# Patient Record
Sex: Female | Born: 1935 | Race: White | Hispanic: No | Smoking: Never smoker
Health system: Southern US, Community
[De-identification: ages and names within clinical notes are randomized; demographics above are authoritative.]

## PROBLEM LIST (undated history)

## (undated) DIAGNOSIS — I639 Cerebral infarction, unspecified: Secondary | ICD-10-CM

## (undated) DIAGNOSIS — Z9889 Other specified postprocedural states: Secondary | ICD-10-CM

## (undated) DIAGNOSIS — R112 Nausea with vomiting, unspecified: Secondary | ICD-10-CM

## (undated) DIAGNOSIS — M1990 Unspecified osteoarthritis, unspecified site: Secondary | ICD-10-CM

## (undated) DIAGNOSIS — M8580 Other specified disorders of bone density and structure, unspecified site: Secondary | ICD-10-CM

## (undated) DIAGNOSIS — E7800 Pure hypercholesterolemia, unspecified: Secondary | ICD-10-CM

## (undated) DIAGNOSIS — I10 Essential (primary) hypertension: Secondary | ICD-10-CM

## (undated) DIAGNOSIS — T8859XA Other complications of anesthesia, initial encounter: Secondary | ICD-10-CM

## (undated) DIAGNOSIS — R519 Headache, unspecified: Secondary | ICD-10-CM

## (undated) DIAGNOSIS — T4145XA Adverse effect of unspecified anesthetic, initial encounter: Secondary | ICD-10-CM

## (undated) DIAGNOSIS — R51 Headache: Secondary | ICD-10-CM

## (undated) HISTORY — PX: ABDOMINAL HYSTERECTOMY: SHX81

## (undated) HISTORY — PX: CHOLECYSTECTOMY: SHX55

---

## 2003-11-09 ENCOUNTER — Ambulatory Visit: Payer: Self-pay | Admitting: Internal Medicine

## 2004-01-11 ENCOUNTER — Ambulatory Visit: Payer: Self-pay | Admitting: Internal Medicine

## 2004-08-01 ENCOUNTER — Ambulatory Visit: Payer: Self-pay | Admitting: Internal Medicine

## 2004-11-21 ENCOUNTER — Ambulatory Visit: Payer: Self-pay | Admitting: Internal Medicine

## 2004-12-02 ENCOUNTER — Ambulatory Visit: Payer: Self-pay | Admitting: Internal Medicine

## 2005-02-06 ENCOUNTER — Ambulatory Visit: Payer: Self-pay | Admitting: Internal Medicine

## 2005-12-02 ENCOUNTER — Ambulatory Visit: Payer: Self-pay | Admitting: Internal Medicine

## 2006-04-06 ENCOUNTER — Ambulatory Visit: Payer: Self-pay | Admitting: Internal Medicine

## 2006-08-09 ENCOUNTER — Ambulatory Visit: Payer: Self-pay | Admitting: Internal Medicine

## 2006-12-17 ENCOUNTER — Ambulatory Visit: Payer: Self-pay | Admitting: Internal Medicine

## 2006-12-28 ENCOUNTER — Ambulatory Visit: Payer: Self-pay | Admitting: Internal Medicine

## 2008-05-24 ENCOUNTER — Ambulatory Visit: Payer: Self-pay | Admitting: Internal Medicine

## 2008-05-28 ENCOUNTER — Ambulatory Visit: Payer: Self-pay | Admitting: Internal Medicine

## 2008-12-04 ENCOUNTER — Ambulatory Visit: Payer: Self-pay | Admitting: Surgery

## 2010-01-08 ENCOUNTER — Ambulatory Visit: Payer: Self-pay | Admitting: Internal Medicine

## 2010-10-22 IMAGING — US ULTRASOUND LEFT BREAST
1 series · 14 of 14 positions shown · non-contrast
Comparison: none

REASON FOR EXAM: left parenchymal density  US if needed
COMMENTS:

PROCEDURE:     US  - US BREAST LEFT  - May 28, 2008 [DATE]
RESULT:       No cystic or solid abnormalities are identified.
Nevertheless, needle localization and biopsy of the lesion demonstrated on
additional view mammography is suggested.

[Series 1: ultrasound left breast · 14 of 14 slices shown]
[im 1/14]
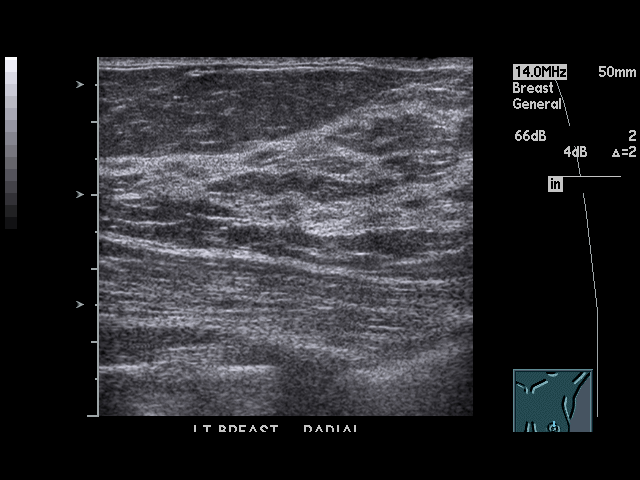
[im 2/14]
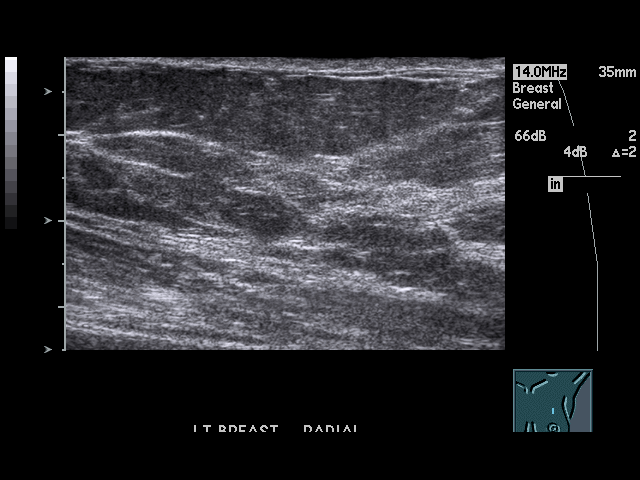
[im 3/14]
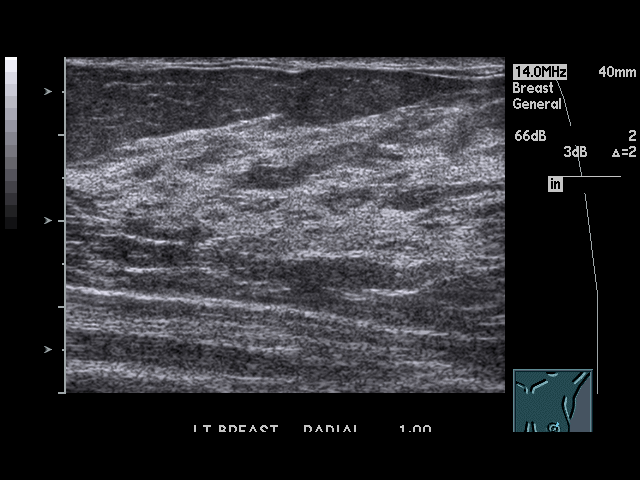
[im 4/14]
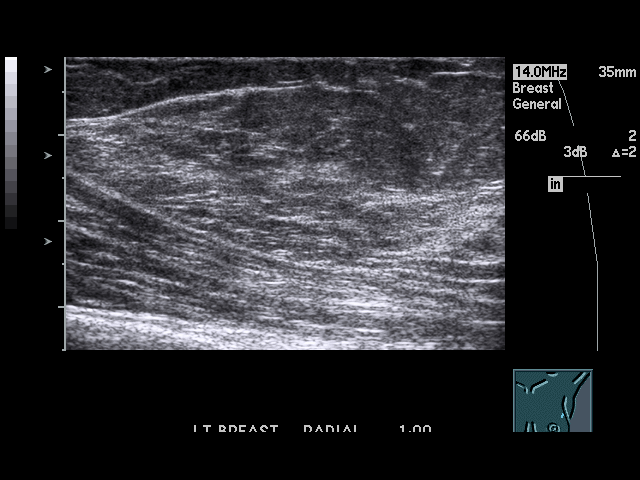
[im 5/14]
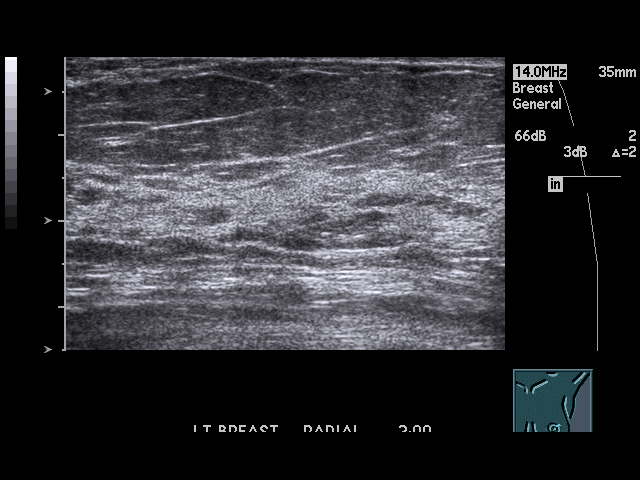
[im 6/14]
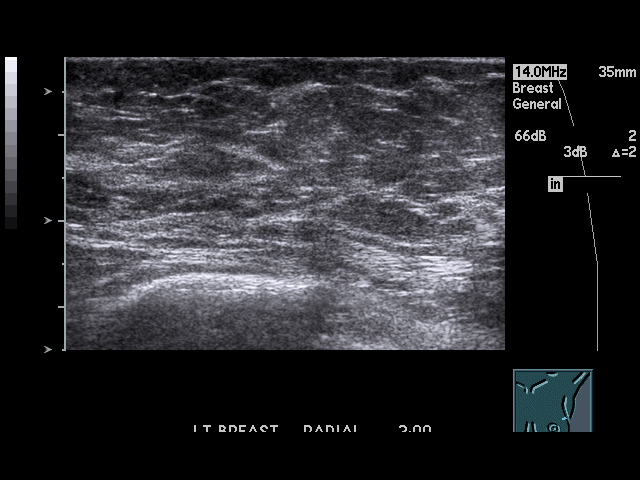
[im 7/14]
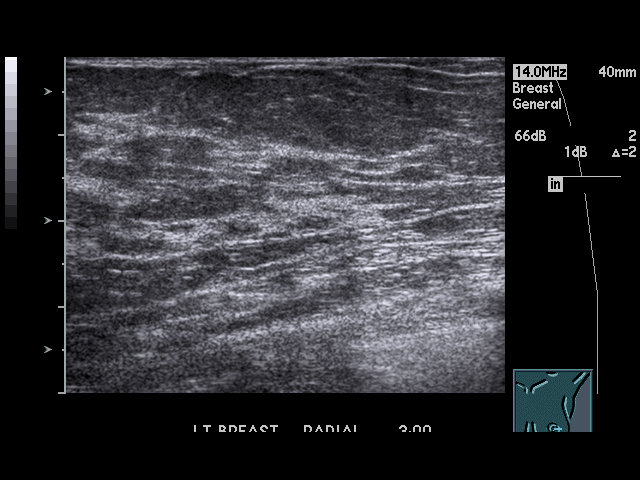
[im 8/14]
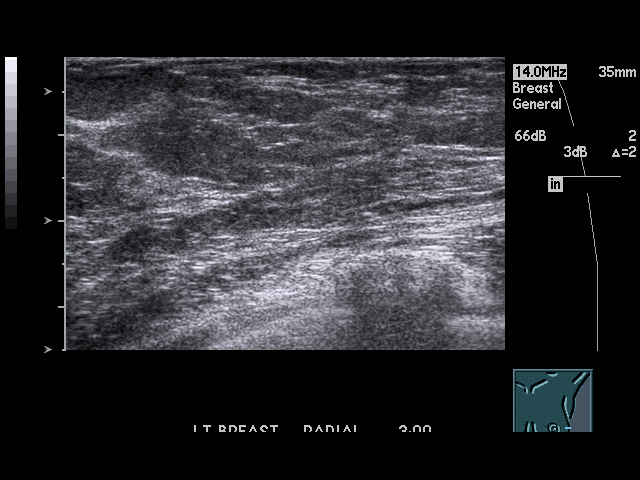
[im 9/14]
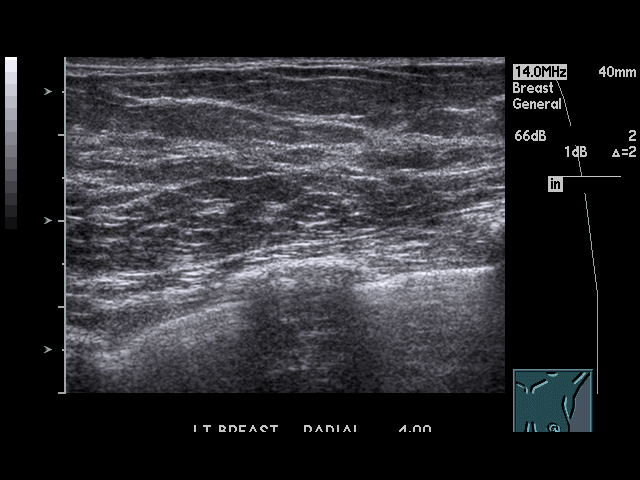
[im 10/14]
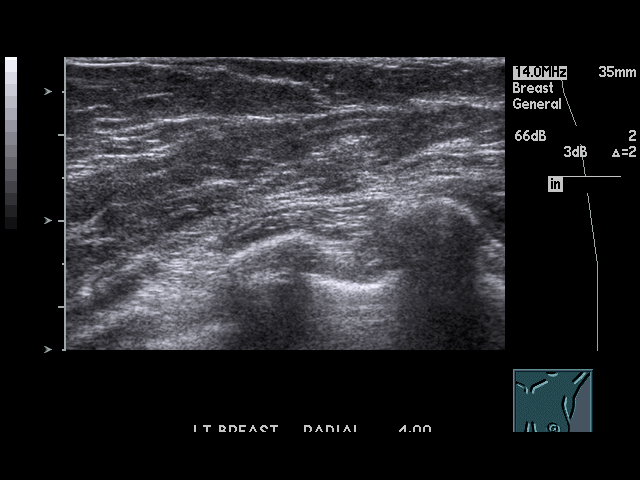
[im 11/14]
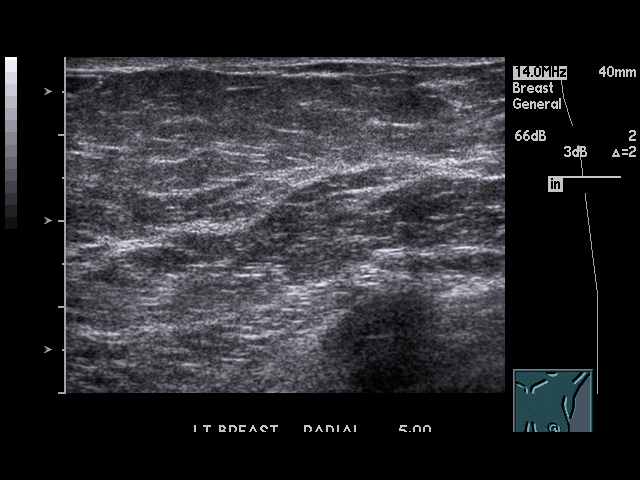
[im 12/14]
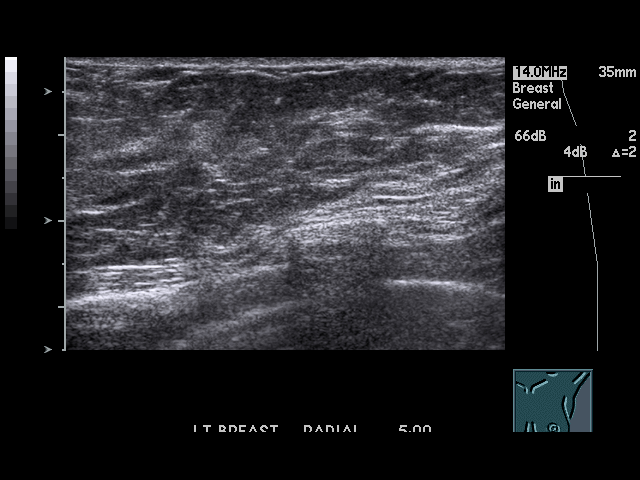
[im 13/14]
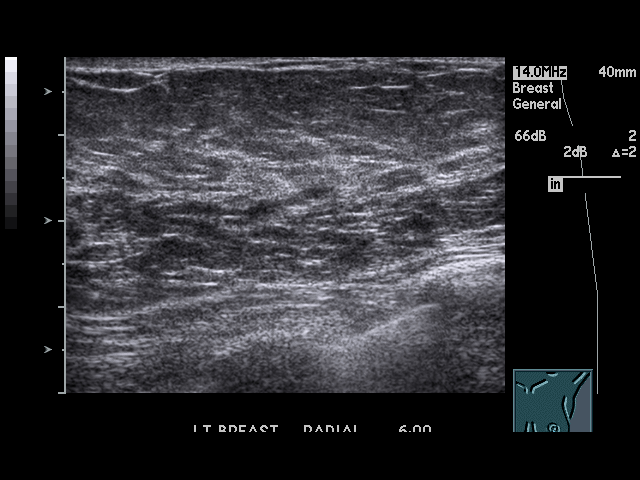
[im 14/14]
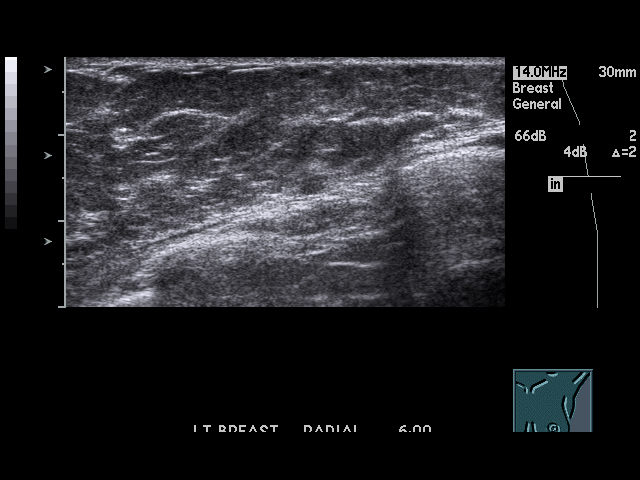

[14 of 14 positions shown; findings below may reference images not displayed]

IMPRESSION: BI-RADS:  Category 4- Suspicious Abnormality.

A negative mammogram report does not preclude biopsy or other evaluation of
a clinically palpable or otherwise suspicious mass or lesion.  Breast cancer
may not be detected by mammography in up to 10% of cases.

## 2011-03-10 ENCOUNTER — Ambulatory Visit: Payer: Self-pay | Admitting: Internal Medicine

## 2012-03-10 ENCOUNTER — Ambulatory Visit: Payer: Self-pay | Admitting: Internal Medicine

## 2013-03-13 ENCOUNTER — Ambulatory Visit: Payer: Self-pay | Admitting: Internal Medicine

## 2015-02-19 ENCOUNTER — Other Ambulatory Visit: Payer: Self-pay | Admitting: Internal Medicine

## 2015-02-19 DIAGNOSIS — Z1231 Encounter for screening mammogram for malignant neoplasm of breast: Secondary | ICD-10-CM

## 2015-03-05 ENCOUNTER — Ambulatory Visit
Admission: RE | Admit: 2015-03-05 | Discharge: 2015-03-05 | Disposition: A | Discharge status: Home / Self Care | Payer: Medicare Other | Source: Ambulatory Visit | Attending: Internal Medicine | Admitting: Internal Medicine

## 2015-03-05 DIAGNOSIS — Z1231 Encounter for screening mammogram for malignant neoplasm of breast: Secondary | ICD-10-CM | POA: Diagnosis not present

## 2016-03-18 ENCOUNTER — Other Ambulatory Visit: Payer: Self-pay | Admitting: Internal Medicine

## 2016-03-18 DIAGNOSIS — Z1231 Encounter for screening mammogram for malignant neoplasm of breast: Secondary | ICD-10-CM

## 2016-04-16 ENCOUNTER — Encounter: Payer: Self-pay | Admitting: Radiology

## 2016-04-16 ENCOUNTER — Ambulatory Visit
Admission: RE | Admit: 2016-04-16 | Discharge: 2016-04-16 | Disposition: A | Discharge status: Home / Self Care | Payer: Medicare Other | Source: Ambulatory Visit | Attending: Internal Medicine | Admitting: Internal Medicine

## 2016-04-16 DIAGNOSIS — Z1231 Encounter for screening mammogram for malignant neoplasm of breast: Secondary | ICD-10-CM | POA: Insufficient documentation

## 2016-06-01 ENCOUNTER — Encounter: Payer: Self-pay | Admitting: *Deleted

## 2016-06-04 ENCOUNTER — Encounter: Payer: Self-pay | Admitting: *Deleted

## 2016-06-04 ENCOUNTER — Ambulatory Visit: Payer: Medicare Other | Admitting: Anesthesiology

## 2016-06-04 ENCOUNTER — Ambulatory Visit
Admission: RE | Admit: 2016-06-04 | Discharge: 2016-06-04 | Disposition: A | Discharge status: Home / Self Care | Payer: Medicare Other | Source: Ambulatory Visit | Attending: Ophthalmology | Admitting: Ophthalmology

## 2016-06-04 ENCOUNTER — Encounter
Admission: RE | Disposition: A | Discharge status: Home / Self Care | Payer: Self-pay | Source: Ambulatory Visit | Attending: Ophthalmology

## 2016-06-04 DIAGNOSIS — I10 Essential (primary) hypertension: Secondary | ICD-10-CM | POA: Insufficient documentation

## 2016-06-04 DIAGNOSIS — Z8673 Personal history of transient ischemic attack (TIA), and cerebral infarction without residual deficits: Secondary | ICD-10-CM | POA: Diagnosis not present

## 2016-06-04 DIAGNOSIS — Z79899 Other long term (current) drug therapy: Secondary | ICD-10-CM | POA: Insufficient documentation

## 2016-06-04 DIAGNOSIS — H401112 Primary open-angle glaucoma, right eye, moderate stage: Secondary | ICD-10-CM | POA: Diagnosis not present

## 2016-06-04 DIAGNOSIS — H2511 Age-related nuclear cataract, right eye: Secondary | ICD-10-CM | POA: Insufficient documentation

## 2016-06-04 HISTORY — DX: Headache: R51

## 2016-06-04 HISTORY — DX: Unspecified osteoarthritis, unspecified site: M19.90

## 2016-06-04 HISTORY — DX: Cerebral infarction, unspecified: I63.9

## 2016-06-04 HISTORY — DX: Headache, unspecified: R51.9

## 2016-06-04 HISTORY — DX: Essential (primary) hypertension: I10

## 2016-06-04 HISTORY — PX: CATARACT EXTRACTION W/PHACO: SHX586

## 2016-06-04 SURGERY — PHACOEMULSIFICATION, CATARACT, WITH IOL INSERTION
Anesthesia: Monitor Anesthesia Care | Site: Eye | Laterality: Right | Wound class: Clean

## 2016-06-04 MED ORDER — MIDAZOLAM HCL 2 MG/2ML IJ SOLN
INTRAMUSCULAR | Status: AC
  Filled 2016-06-04: qty 2

## 2016-06-04 MED ORDER — HYALURONIDASE HUMAN 150 UNIT/ML IJ SOLN
INTRAMUSCULAR | Status: AC
Start: 2016-06-04 — End: 2016-06-04
  Filled 2016-06-04: qty 1

## 2016-06-04 MED ORDER — SODIUM HYALURONATE 23 MG/ML IO SOLN
INTRAOCULAR | Status: DC | PRN
  Administered 2016-06-04: 0.6 mL via INTRAOCULAR

## 2016-06-04 MED ORDER — MOXIFLOXACIN HCL 0.5 % OP SOLN: 1.0000 [drp] | OPHTHALMIC | Status: DC | PRN

## 2016-06-04 MED ORDER — POVIDONE-IODINE 5 % OP SOLN
OPHTHALMIC | Status: AC
  Filled 2016-06-04: qty 30

## 2016-06-04 MED ORDER — ARMC OPHTHALMIC DILATING DROPS
1.0000 "application " | OPHTHALMIC | Status: AC
  Administered 2016-06-04 (×2): 1 via OPHTHALMIC

## 2016-06-04 MED ORDER — EPINEPHRINE PF 1 MG/ML IJ SOLN
INTRAOCULAR | Status: DC | PRN
  Administered 2016-06-04: 10:00:00 via OPHTHALMIC

## 2016-06-04 MED ORDER — FENTANYL CITRATE (PF) 100 MCG/2ML IJ SOLN
INTRAMUSCULAR | Status: AC
  Filled 2016-06-04: qty 2

## 2016-06-04 MED ORDER — SODIUM CHLORIDE 0.9 % IV SOLN
INTRAVENOUS | Status: DC
  Administered 2016-06-04: 10:00:00 via INTRAVENOUS

## 2016-06-04 MED ORDER — POVIDONE-IODINE 5 % OP SOLN
OPHTHALMIC | Status: DC | PRN
  Administered 2016-06-04: 1 via OPHTHALMIC

## 2016-06-04 MED ORDER — ARMC OPHTHALMIC DILATING DROPS
OPHTHALMIC | Status: AC
  Administered 2016-06-04: 09:00:00
  Filled 2016-06-04: qty 0.4

## 2016-06-04 MED ORDER — LIDOCAINE HCL (PF) 2 % IJ SOLN
INTRAMUSCULAR | Status: AC
  Filled 2016-06-04: qty 2

## 2016-06-04 MED ORDER — CARBACHOL 0.01 % IO SOLN
INTRAOCULAR | Status: DC | PRN
  Administered 2016-06-04: 0.5 mL via INTRAOCULAR

## 2016-06-04 MED ORDER — ONDANSETRON HCL 4 MG/2ML IJ SOLN
INTRAMUSCULAR | Status: DC | PRN
  Administered 2016-06-04 (×2): 4 mg via INTRAVENOUS

## 2016-06-04 MED ORDER — LIDOCAINE HCL (PF) 4 % IJ SOLN
INTRAOCULAR | Status: DC | PRN
  Administered 2016-06-04: 4 mL via OPHTHALMIC

## 2016-06-04 MED ORDER — SODIUM HYALURONATE 10 MG/ML IO SOLN
INTRAOCULAR | Status: DC | PRN
  Administered 2016-06-04: 0.55 mL via INTRAOCULAR

## 2016-06-04 MED ORDER — LIDOCAINE HCL (PF) 4 % IJ SOLN
INTRAMUSCULAR | Status: DC | PRN
  Administered 2016-06-04: 9 mL via OPHTHALMIC

## 2016-06-04 MED ORDER — MOXIFLOXACIN HCL 0.5 % OP SOLN
OPHTHALMIC | Status: AC
  Filled 2016-06-04: qty 3

## 2016-06-04 MED ORDER — SODIUM HYALURONATE 23 MG/ML IO SOLN
INTRAOCULAR | Status: AC
  Filled 2016-06-04: qty 0.6

## 2016-06-04 MED ORDER — LIDOCAINE HCL (CARDIAC) 20 MG/ML IV SOLN
INTRAVENOUS | Status: DC | PRN
  Administered 2016-06-04: 100 mg via INTRAVENOUS

## 2016-06-04 MED ORDER — ALFENTANIL 500 MCG/ML IJ INJ
INJECTION | INTRAVENOUS | Status: DC | PRN
  Administered 2016-06-04 (×2): 100 ug via INTRAVENOUS
  Administered 2016-06-04: 500 ug via INTRAVENOUS

## 2016-06-04 MED ORDER — ONDANSETRON HCL 4 MG/2ML IJ SOLN
INTRAMUSCULAR | Status: AC
  Filled 2016-06-04: qty 2

## 2016-06-04 MED ORDER — SODIUM HYALURONATE 10 MG/ML IO SOLN
INTRAOCULAR | Status: AC
  Filled 2016-06-04: qty 0.85

## 2016-06-04 MED ORDER — EPINEPHRINE PF 1 MG/ML IJ SOLN
INTRAMUSCULAR | Status: AC
  Filled 2016-06-04: qty 2

## 2016-06-04 MED ORDER — MOXIFLOXACIN HCL 0.5 % OP SOLN
OPHTHALMIC | Status: DC | PRN
  Administered 2016-06-04: 0.2 mL via OPHTHALMIC

## 2016-06-04 SURGICAL SUPPLY — 17 items
DISSECTOR HYDRO NUCLEUS 50X22 (MISCELLANEOUS) ×3 IMPLANT
GLOVE BIO SURGEON STRL SZ8 (GLOVE) ×3 IMPLANT
GLOVE BIOGEL M 6.5 STRL (GLOVE) ×3 IMPLANT
GLOVE SURG LX 7.5 STRW (GLOVE) ×2
GLOVE SURG LX STRL 7.5 STRW (GLOVE) ×1 IMPLANT
GOWN STRL REUS W/ TWL LRG LVL3 (GOWN DISPOSABLE) ×2 IMPLANT
GOWN STRL REUS W/TWL LRG LVL3 (GOWN DISPOSABLE) ×4
ISTENT OPTHAL 1X.33 ADULT LF (Permanent Stent) ×3 IMPLANT
LENS IOL TECNIS ITEC 21.0 (Intraocular Lens) ×3 IMPLANT
PACK CATARACT (MISCELLANEOUS) ×3 IMPLANT
PACK CATARACT KING (MISCELLANEOUS) ×2 IMPLANT
PACK EYE AFTER SURG (MISCELLANEOUS) ×3 IMPLANT
SOL BSS BAG (MISCELLANEOUS) ×3
SOLUTION BSS BAG (MISCELLANEOUS) ×1 IMPLANT
STENT 'ISTENT OPTH 1X.33 ADLT (Permanent Stent) ×1 IMPLANT
WATER STERILE IRR 250ML POUR (IV SOLUTION) ×3 IMPLANT
WIPE NON LINTING 3.25X3.25 (MISCELLANEOUS) ×3 IMPLANT

## 2016-06-04 NOTE — Transfer of Care (Signed)
Immediate Anesthesia Transfer of Care Note  Patient: Ann Ho  Procedure(s) Performed: Procedure(s) with comments: CATARACT EXTRACTION PHACO AND INTRAOCULAR LENS PLACEMENT (IOC) (Right) - [place] 31.7 AP% 8.3 CDE 2.62 Fluid Pack Lot # [id number]  Patient Location: PACU  Anesthesia Type:MAC  Level of Consciousness: awake  Airway & Oxygen Therapy: Patient Spontanous Breathing and Patient connected to nasal cannula oxygen  Post-op Assessment: Report given to RN and Post -op Vital signs reviewed and stable  Post vital signs: Reviewed and stable  Last Vitals:  Vitals:   06/04/16 0824 06/04/16 1101  BP: 121/71 134/62  Pulse: 74 70  Resp: 16 14  Temp: 37 C (!) 35.9 C    Last Pain:  Vitals:   06/04/16 1101  TempSrc: Temporal         Complications: No apparent anesthesia complications

## 2016-06-04 NOTE — OR Nursing (Signed)
Takes following at home (unable to enter into epic)  Tumeric 520m bid - last dose 05/28/16 Bulbery 405mbid - last dose 05/28/16 Vitamin D3 1000 IU bid - lst dose 05/28/16

## 2016-06-04 NOTE — H&P (Signed)
The History and Physical notes are on paper, have been signed, and are to be scanned.   I have examined the patient and there are no changes to the H&P.   Willey BladeBradley King 06/04/2016 10:05 AM

## 2016-06-04 NOTE — Anesthesia Post-op Follow-up Note (Cosign Needed)
Anesthesia QCDR form completed.        

## 2016-06-04 NOTE — Discharge Instructions (Signed)
Eye Surgery Discharge Instructions  Expect mild scratchy sensation or mild soreness. DO NOT RUB YOUR EYE!  The day of surgery:  Minimal physical activity, but bed rest is not required  No reading, computer work, or close hand work  No bending, lifting, or straining.  May watch TV  For 24 hours:  No driving, legal decisions, or alcoholic beverages  Safety precautions  Eat anything you prefer: It is better to start with liquids, then soup then solid foods.  _____ Eye patch should be worn until postoperative exam tomorrow.  ____ Solar shield eyeglasses should be worn for comfort in the sunlight/patch while sleeping  Resume all regular medications including aspirin or Coumadin if these were discontinued prior to surgery. You may shower, bathe, shave, or wash your hair. Tylenol may be taken for mild discomfort.  Call your doctor if you experience significant pain, nausea, or vomiting, fever > 101 or other signs of infection. [phone] or [phone] Specific instructions:  Follow-up Information    [place] CraneKing, Bradley Mark, MD Follow up.   Specialty:  Ophthalmology Why:  06-05-16 at 10:00 Contact information: [address] [place] [address] [phone]          Eye Surgery Discharge Instructions  Expect mild scratchy sensation or mild soreness. DO NOT RUB YOUR EYE!  The day of surgery:  Minimal physical activity, but bed rest is not required  No reading, computer work, or close hand work  No bending, lifting, or straining.  May watch TV  For 24 hours:  No driving, legal decisions, or alcoholic beverages  Safety precautions  Eat anything you prefer: It is better to start with liquids, then soup then solid foods.  _____ Eye patch should be worn until postoperative exam tomorrow.  ____ Solar shield eyeglasses should be worn for comfort in the sunlight/patch while sleeping  Resume all regular medications including aspirin or Coumadin if these were  discontinued prior to surgery. You may shower, bathe, shave, or wash your hair. Tylenol may be taken for mild discomfort.  Call your doctor if you experience significant pain, nausea, or vomiting, fever > 101 or other signs of infection. [phone] or [phone] Specific instructions:  Follow-up Information    [place] CraneKing, Bradley Mark, MD Follow up.   Specialty:  Ophthalmology Why:  06-05-16 at 10:00 Contact information: [address] [place] [address] [phone]

## 2016-06-04 NOTE — Progress Notes (Signed)
NO NAUSEA AT PRESENT

## 2016-06-04 NOTE — Anesthesia Preprocedure Evaluation (Signed)
Anesthesia Evaluation  Patient identified by MRN, date of birth, ID band Patient awake    Reviewed: Allergy & Precautions, NPO status , Patient's Chart, lab work & pertinent test results  History of Anesthesia Complications Negative for: history of anesthetic complications  Airway Mallampati: II       Dental   Pulmonary neg pulmonary ROS,           Cardiovascular hypertension, Pt. on medications negative cardio ROS       Neuro/Psych CVA (dizziness), No Residual Symptoms    GI/Hepatic negative GI ROS, Neg liver ROS,   Endo/Other  negative endocrine ROS  Renal/GU negative Renal ROS     Musculoskeletal   Abdominal   Peds  Hematology negative hematology ROS (+)   Anesthesia Other Findings   Reproductive/Obstetrics                             Anesthesia Physical Anesthesia Plan  ASA: III  Anesthesia Plan: MAC   Post-op Pain Management:    Induction:   Airway Management Planned: Nasal Cannula  Additional Equipment:   Intra-op Plan:   Post-operative Plan:   Informed Consent: I have reviewed the patients History and Physical, chart, labs and discussed the procedure including the risks, benefits and alternatives for the proposed anesthesia with the patient or authorized representative who has indicated his/her understanding and acceptance.     Plan Discussed with:   Anesthesia Plan Comments:         Anesthesia Quick Evaluation

## 2016-06-04 NOTE — Progress Notes (Signed)
PT WAS  HAVING SOME NAUSEA BUT STATES BETTER   DR Brooke DareKING INTO SEE

## 2016-06-04 NOTE — Anesthesia Postprocedure Evaluation (Signed)
Anesthesia Post Note  Patient: Ann Ho  Procedure(s) Performed: Procedure(s) (LRB): CATARACT EXTRACTION PHACO AND INTRAOCULAR LENS PLACEMENT (IOC) (Right)  Patient location during evaluation: Short Stay Anesthesia Type: MAC Level of consciousness: awake Pain management: pain level controlled Vital Signs Assessment: post-procedure vital signs reviewed and stable Respiratory status: spontaneous breathing Cardiovascular status: blood pressure returned to baseline Postop Assessment: no headache Anesthetic complications: no     Last Vitals:  Vitals:   06/04/16 0824 06/04/16 1101  BP: 121/71 134/62  Pulse: 74 70  Resp: 16 14  Temp: 37 C (!) 35.9 C    Last Pain:  Vitals:   06/04/16 1101  TempSrc: Temporal                 Buckner Malta

## 2016-06-04 NOTE — Op Note (Signed)
OPERATIVE NOTE  Ann Ho [id number] 06/04/2016  PREOPERATIVE DIAGNOSIS:   1.  Moderate open angle glaucoma, right eye. W54.6270 2.  Nuclear sclerotic cataract right eye.  H25.11   POSTOPERATIVE DIAGNOSIS:    same.   PROCEDURE:   1.  Placement of trabecular bypass stent (istent), right eye. CPT 0191T 2. Phacoemusification with posterior chamber intraocular lens placement of the right eye  CPT [phone]   LENS:  Implant Name Type Inv. Item Serial No. Manufacturer Lot No. LRB No. Used  LENS IOL DIOP 21.0 - [id number] Intraocular Lens LENS IOL DIOP 21.0 [id number] AMO  Right 1  ISTENT LEFT - [id number] Permanent Stent ISTENT LEFT [id number] [employer] [id number] Right 1       Other implanted device:  [employer] Istent model [id number] is a "left" stent which works best for a right Administrator and was placed in the right eye.   ULTRASOUND TIME: 0 minutes 31.7 seconds.  CDE 2.62   SURGEON:  Benay Pillow, MD, MPH  ANESTHESIOLOGIST: Anesthesiologist: Gunnar Fusi, MD CRNA: Nelda Marseille, CRNA; Allean Found, CRNA   ANESTHESIA:  MAC with retrobulbar block of 50/50% mix of 0.75% bupivicaine, and 4% preservative free lidocaine with a small amount of vitrase and intracameral preservative-free intracameral lidocaine 4%.  ESTIMATED BLOOD LOSS: less than 1 mL.   COMPLICATIONS:  None.   DESCRIPTION OF PROCEDURE:  The patient was identified in the holding room and transported to the operating room.   The patient was placed in the supine position under the operating microscope.   A retrobulbar block was performed with 3 cc.  The right eye was prepped and draped in the usual sterile ophthalmic fashion.   A 1.0 millimeter clear-corneal paracentesis was made at the 10:30 position. 0.5 ml of preservative-free 1% lidocaine with epinephrine was injected into the anterior chamber.  The anterior chamber was filled with Healon 5 viscoelastic.  A 2.4 millimeter keratome was used to  make a near-clear corneal incision at the 8:00 position.   Attention was turned to the istent.  The patients head was turned to the left and the microscope was tilted to 035 degrees.  Ocular instruments/Gl[employer]os OAL/H2 gonioprism was used with IPC05 (iclip) coupled with Healon 5 on the cornea was used to visualize the trabecular meshwork. The istent was opened and introduced into the eye.  The meshwork was engaged with the tip of the iStent and then using the inserter, the iStent slid down Schlemm's canal until the stent was well seated.  The stent was then released from the inserter.  The iStent was inspected and in good position.  Next, attention was turned to the phacoemulsification A curvilinear capsulorrhexis was made with a cystotome and capsulorrhexis forceps.  Balanced salt solution was used to hydrodissect and hydrodelineate the nucleus.   Phacoemulsification was then used in stop and chop fashion to remove the lens nucleus and epinucleus.  The remaining cortex was then removed using the irrigation and aspiration handpiece. Healon was then placed into the capsular bag to distend it for lens placement.  A lens was then injected into the capsular bag.  The remaining viscoelastic was aspirated.   Wounds were hydrated with balanced salt solution.  The anterior chamber was inflated to a physiologic pressure with balanced salt solution.    Intracameral vigamox 0.1 mL undiluted was injected into the eye and a drop placed onto the ocular surface.  No wound leaks were noted.  Maxitrol ointment, a patch and shield were  placed.  The patient was taken to the recovery room in stable condition without complications of anesthesia or surgery   Benay Pillow 06/04/2016, 11:15 AM

## 2016-06-18 ENCOUNTER — Ambulatory Visit
Admission: RE | Admit: 2016-06-18 | Discharge: 2016-06-18 | Disposition: A | Discharge status: Home / Self Care | Payer: Medicare Other | Source: Ambulatory Visit | Attending: Ophthalmology | Admitting: Ophthalmology

## 2016-06-18 ENCOUNTER — Encounter: Payer: Self-pay | Admitting: *Deleted

## 2016-06-18 ENCOUNTER — Ambulatory Visit: Payer: Medicare Other | Admitting: Anesthesiology

## 2016-06-18 ENCOUNTER — Encounter
Admission: RE | Disposition: A | Discharge status: Home / Self Care | Payer: Self-pay | Source: Ambulatory Visit | Attending: Ophthalmology

## 2016-06-18 DIAGNOSIS — Z90710 Acquired absence of both cervix and uterus: Secondary | ICD-10-CM | POA: Insufficient documentation

## 2016-06-18 DIAGNOSIS — M1990 Unspecified osteoarthritis, unspecified site: Secondary | ICD-10-CM | POA: Insufficient documentation

## 2016-06-18 DIAGNOSIS — H4010X0 Unspecified open-angle glaucoma, stage unspecified: Secondary | ICD-10-CM | POA: Insufficient documentation

## 2016-06-18 DIAGNOSIS — E7800 Pure hypercholesterolemia, unspecified: Secondary | ICD-10-CM | POA: Diagnosis not present

## 2016-06-18 DIAGNOSIS — Z8673 Personal history of transient ischemic attack (TIA), and cerebral infarction without residual deficits: Secondary | ICD-10-CM | POA: Diagnosis not present

## 2016-06-18 DIAGNOSIS — I10 Essential (primary) hypertension: Secondary | ICD-10-CM | POA: Insufficient documentation

## 2016-06-18 DIAGNOSIS — Z9049 Acquired absence of other specified parts of digestive tract: Secondary | ICD-10-CM | POA: Diagnosis not present

## 2016-06-18 DIAGNOSIS — H2512 Age-related nuclear cataract, left eye: Secondary | ICD-10-CM | POA: Insufficient documentation

## 2016-06-18 DIAGNOSIS — Z888 Allergy status to other drugs, medicaments and biological substances status: Secondary | ICD-10-CM | POA: Diagnosis not present

## 2016-06-18 HISTORY — DX: Other specified postprocedural states: Z98.890

## 2016-06-18 HISTORY — DX: Nausea with vomiting, unspecified: R11.2

## 2016-06-18 HISTORY — DX: Adverse effect of unspecified anesthetic, initial encounter: T41.45XA

## 2016-06-18 HISTORY — DX: Other specified disorders of bone density and structure, unspecified site: M85.80

## 2016-06-18 HISTORY — DX: Other complications of anesthesia, initial encounter: T88.59XA

## 2016-06-18 HISTORY — DX: Pure hypercholesterolemia, unspecified: E78.00

## 2016-06-18 HISTORY — PX: CATARACT EXTRACTION W/PHACO: SHX586

## 2016-06-18 SURGERY — PHACOEMULSIFICATION, CATARACT, WITH IOL INSERTION
Anesthesia: Monitor Anesthesia Care | Site: Eye | Laterality: Left | Wound class: Clean

## 2016-06-18 MED ORDER — POVIDONE-IODINE 5 % OP SOLN
OPHTHALMIC | Status: DC | PRN
  Administered 2016-06-18: 1 via OPHTHALMIC

## 2016-06-18 MED ORDER — FENTANYL CITRATE (PF) 100 MCG/2ML IJ SOLN: 25.0000 ug | INTRAMUSCULAR | Status: DC | PRN

## 2016-06-18 MED ORDER — LIDOCAINE HCL (PF) 4 % IJ SOLN
INTRAMUSCULAR | Status: DC | PRN
  Administered 2016-06-18: 4 mL via OPHTHALMIC

## 2016-06-18 MED ORDER — SODIUM HYALURONATE 10 MG/ML IO SOLN
INTRAOCULAR | Status: AC
  Filled 2016-06-18: qty 0.85

## 2016-06-18 MED ORDER — LIDOCAINE HCL (PF) 2 % IJ SOLN
INTRAMUSCULAR | Status: AC
  Filled 2016-06-18: qty 4

## 2016-06-18 MED ORDER — EPINEPHRINE PF 1 MG/ML IJ SOLN
INTRAMUSCULAR | Status: AC
  Filled 2016-06-18: qty 2

## 2016-06-18 MED ORDER — SODIUM HYALURONATE 23 MG/ML IO SOLN
INTRAOCULAR | Status: AC
  Filled 2016-06-18: qty 0.6

## 2016-06-18 MED ORDER — ARMC OPHTHALMIC DILATING DROPS
1.0000 "application " | OPHTHALMIC | Status: AC | PRN
  Administered 2016-06-18 (×3): 1 via OPHTHALMIC

## 2016-06-18 MED ORDER — SODIUM HYALURONATE 23 MG/ML IO SOLN
INTRAOCULAR | Status: DC | PRN
  Administered 2016-06-18: 0.6 mL via INTRAOCULAR

## 2016-06-18 MED ORDER — ALFENTANIL 500 MCG/ML IJ INJ
INJECTION | INTRAVENOUS | Status: AC
  Filled 2016-06-18: qty 5

## 2016-06-18 MED ORDER — ARMC OPHTHALMIC DILATING DROPS
OPHTHALMIC | Status: AC
  Filled 2016-06-18: qty 0.4

## 2016-06-18 MED ORDER — MOXIFLOXACIN HCL 0.5 % OP SOLN: 1.0000 [drp] | OPHTHALMIC | Status: AC | PRN

## 2016-06-18 MED ORDER — MOXIFLOXACIN HCL 0.5 % OP SOLN
OPHTHALMIC | Status: AC
  Filled 2016-06-18: qty 3

## 2016-06-18 MED ORDER — MIDAZOLAM HCL 2 MG/2ML IJ SOLN
INTRAMUSCULAR | Status: AC
  Filled 2016-06-18: qty 2

## 2016-06-18 MED ORDER — NEOMYCIN-POLYMYXIN-DEXAMETH 3.5-10000-0.1 OP OINT
TOPICAL_OINTMENT | OPHTHALMIC | Status: DC | PRN
  Administered 2016-06-18: 1 via OPHTHALMIC

## 2016-06-18 MED ORDER — POVIDONE-IODINE 5 % OP SOLN
OPHTHALMIC | Status: AC
  Filled 2016-06-18: qty 30

## 2016-06-18 MED ORDER — HYALURONIDASE HUMAN 150 UNIT/ML IJ SOLN
INTRAMUSCULAR | Status: AC
  Filled 2016-06-18: qty 1

## 2016-06-18 MED ORDER — BSS IO SOLN
INTRAOCULAR | Status: DC | PRN
  Administered 2016-06-18: 200 mL via OPHTHALMIC

## 2016-06-18 MED ORDER — ONDANSETRON HCL 4 MG/2ML IJ SOLN
INTRAMUSCULAR | Status: DC | PRN
  Administered 2016-06-18: 4 mg via INTRAVENOUS

## 2016-06-18 MED ORDER — ALFENTANIL 500 MCG/ML IJ INJ
INJECTION | INTRAVENOUS | Status: DC | PRN
Start: 2016-06-18 — End: 2016-06-18
  Administered 2016-06-18: 250 ug via INTRAVENOUS
  Administered 2016-06-18: 500 ug via INTRAVENOUS

## 2016-06-18 MED ORDER — LIDOCAINE HCL (PF) 4 % IJ SOLN
INTRAOCULAR | Status: DC | PRN
  Administered 2016-06-18: 4 mL via OPHTHALMIC

## 2016-06-18 MED ORDER — CARBACHOL 0.01 % IO SOLN
INTRAOCULAR | Status: DC | PRN
  Administered 2016-06-18: 0.5 mL via INTRAOCULAR

## 2016-06-18 MED ORDER — ONDANSETRON HCL 4 MG/2ML IJ SOLN
INTRAMUSCULAR | Status: AC
  Filled 2016-06-18: qty 2

## 2016-06-18 MED ORDER — SODIUM CHLORIDE 0.9 % IV SOLN
INTRAVENOUS | Status: DC
  Administered 2016-06-18: 08:00:00 via INTRAVENOUS

## 2016-06-18 MED ORDER — SODIUM HYALURONATE 10 MG/ML IO SOLN
INTRAOCULAR | Status: DC | PRN
  Administered 2016-06-18: 0.85 mL via INTRAOCULAR

## 2016-06-18 MED ORDER — MOXIFLOXACIN HCL 0.5 % OP SOLN
OPHTHALMIC | Status: DC | PRN
  Administered 2016-06-18: 1 [drp] via OPHTHALMIC

## 2016-06-18 MED ORDER — BUPIVACAINE HCL (PF) 0.75 % IJ SOLN
INTRAMUSCULAR | Status: AC
  Filled 2016-06-18: qty 10

## 2016-06-18 MED ORDER — BACITRACIN-NEOMYCIN-POLYMYXIN 400-5-5000 EX OINT
TOPICAL_OINTMENT | CUTANEOUS | Status: AC
  Filled 2016-06-18: qty 1

## 2016-06-18 MED ORDER — ONDANSETRON HCL 4 MG/2ML IJ SOLN: 4.0000 mg | Freq: Once | INTRAMUSCULAR | Status: DC | PRN

## 2016-06-18 SURGICAL SUPPLY — 17 items
DISSECTOR HYDRO NUCLEUS 50X22 (MISCELLANEOUS) ×3 IMPLANT
GLOVE BIO SURGEON STRL SZ8 (GLOVE) ×3 IMPLANT
GLOVE BIOGEL M 6.5 STRL (GLOVE) ×3 IMPLANT
GLOVE SURG LX 7.5 STRW (GLOVE) ×2
GLOVE SURG LX STRL 7.5 STRW (GLOVE) ×1 IMPLANT
GOWN STRL REUS W/ TWL LRG LVL3 (GOWN DISPOSABLE) ×2 IMPLANT
GOWN STRL REUS W/TWL LRG LVL3 (GOWN DISPOSABLE) ×4
ISTENT OPTHAL 1X.33 ADULT LF (Permanent Stent) ×3 IMPLANT
LENS IOL TECNIS ITEC 20.5 (Intraocular Lens) ×3 IMPLANT
PACK CATARACT (MISCELLANEOUS) ×3 IMPLANT
PACK CATARACT KING (MISCELLANEOUS) ×2 IMPLANT
PACK EYE AFTER SURG (MISCELLANEOUS) ×3 IMPLANT
SOL BSS BAG (MISCELLANEOUS) ×3
SOLUTION BSS BAG (MISCELLANEOUS) ×1 IMPLANT
STENT 'ISTENT OPTH 1X.33 ADLT (Permanent Stent) ×1 IMPLANT
WATER STERILE IRR 250ML POUR (IV SOLUTION) ×3 IMPLANT
WIPE NON LINTING 3.25X3.25 (MISCELLANEOUS) ×3 IMPLANT

## 2016-06-18 NOTE — Anesthesia Preprocedure Evaluation (Addendum)
Anesthesia Evaluation  Patient identified by MRN, date of birth, ID band Patient awake    Reviewed: Allergy & Precautions, NPO status , Patient's Chart, lab work & pertinent test results  History of Anesthesia Complications (+) PONV and history of anesthetic complications  Airway Mallampati: II       Dental   Pulmonary neg pulmonary ROS,    Pulmonary exam normal        Cardiovascular hypertension, Pt. on medications negative cardio ROS Normal cardiovascular exam     Neuro/Psych  Headaches, CVA (dizziness), No Residual Symptoms    GI/Hepatic negative GI ROS, Neg liver ROS,   Endo/Other  negative endocrine ROS  Renal/GU negative Renal ROS     Musculoskeletal  (+) Arthritis , Osteoarthritis,    Abdominal Normal abdominal exam  (+)   Peds  Hematology negative hematology ROS (+)   Anesthesia Other Findings   Reproductive/Obstetrics                             Anesthesia Physical  Anesthesia Plan  ASA: III  Anesthesia Plan: MAC   Post-op Pain Management:    Induction:   Airway Management Planned: Nasal Cannula  Additional Equipment:   Intra-op Plan:   Post-operative Plan:   Informed Consent: I have reviewed the patients History and Physical, chart, labs and discussed the procedure including the risks, benefits and alternatives for the proposed anesthesia with the patient or authorized representative who has indicated his/her understanding and acceptance.     Plan Discussed with:   Anesthesia Plan Comments: (Zofran IV intra-op)       Anesthesia Quick Evaluation

## 2016-06-18 NOTE — Transfer of Care (Signed)
Immediate Anesthesia Transfer of Care Note  Patient: Ann Ho  Procedure(s) Performed: Procedure(s) with comments: CATARACT EXTRACTION PHACO AND INTRAOCULAR LENS PLACEMENT (IOC) INSERTION OF ISTENT (Left) - [place] 00;26.4 AP% 10.3 CDE 2.73 Fluid Pack Lot # [id number]  Patient Location: Short Stay  Anesthesia Type:MAC  Level of Consciousness: awake, alert  and oriented  Airway & Oxygen Therapy: Patient Spontanous Breathing  Post-op Assessment: Post -op Vital signs reviewed and stable  Post vital signs: stable  Last Vitals:  Vitals:   06/18/16 0720 06/18/16 0914  BP: 126/73 120/62  Pulse: 60 60  Resp: 16 12  Temp: 36.5 C 36.6 C    Last Pain:  Vitals:   06/18/16 0914  TempSrc: Oral         Complications: No apparent anesthesia complications

## 2016-06-18 NOTE — Anesthesia Post-op Follow-up Note (Cosign Needed)
Anesthesia QCDR form completed.        

## 2016-06-18 NOTE — Op Note (Signed)
OPERATIVE NOTE  Ann Ho [id number] 06/18/2016  PREOPERATIVE DIAGNOSIS:   1.  Moderate open angle glaucoma, left eye. R67.8938 2.  Nuclear sclerotic cataract left eye.  H25.12   POSTOPERATIVE DIAGNOSIS:    same.   PROCEDURE:   1.  Placement of trabecular bypass stent (istent). CPT 0191T 2. Phacoemusification with posterior chamber intraocular lens placement of the left eye  CPT [phone]   LENS:  Implant Name Type Inv. Item Serial No. Manufacturer Lot No. LRB No. Used  LENS IOL DIOP 20.5 - [id number] Intraocular Lens LENS IOL DIOP 20.5 [id number] AMO  Left 1  ISTENT LEFT - [id number] Permanent Stent ISTENT LEFT [id number] [employer]   Left 1       ULTRASOUND TIME: 0 minutes 26 seconds.  CDE 2.73   SURGEON:  Benay Pillow, MD, MPH  ANESTHESIOLOGIST: Anesthesiologist: Gunnar Fusi, MD CRNA: Nelda Marseille, CRNA; Allean Found, CRNA   ANESTHESIA:  MAC with retrobulbar block of 50/50% mix of 0.75% bupivicaine, and 4% preservative free lidocaine with a small amount of vitrase and intracameral preservative-free intracameral lidocaine 4%.  ESTIMATED BLOOD LOSS: less than 1 mL.   COMPLICATIONS:  None.   DESCRIPTION OF PROCEDURE:  The patient was identified in the holding room and transported to the operating room.   The patient was placed in the supine position under the operating microscope.   A retrobulbar block was performed with 3 cc.  The left eye was prepped and draped in the usual sterile ophthalmic fashion.   A 1.0 millimeter clear-corneal paracentesis was made at the 5:00 position. 0.5 ml of preservative-free 1% lidocaine with epinephrine was injected into the anterior chamber.  The anterior chamber was filled with Healon 5 viscoelastic.  A 2.4 millimeter keratome was used to make a near-clear corneal incision at the 2:30 position.   Attention was turned to the istent.  The patients head was turned and the microscope was tilted to 035  degrees.  Ocular instruments/[employer] OAL/H2 gonioprism was used coupled with Healon 5 on the cornea was used to visualize the trabecular meshwork. The istent was opened and introduced into the eye.  The meshwork was engaged with the tip of the iStent and then using the inserter, the iStent slid down Schlemm's canal until the stent was well seated.  The stent was then released from the inserter.  The iStent was inspected and in good position.  Next, attention was turned to the phacoemulsification A curvilinear capsulorrhexis was made with a cystotome and capsulorrhexis forceps.  Balanced salt solution was used to hydrodissect and hydrodelineate the nucleus.   Phacoemulsification was then used in stop and chop fashion to remove the lens nucleus and epinucleus.  The remaining cortex was then removed using the irrigation and aspiration handpiece. Healon was then placed into the capsular bag to distend it for lens placement.  A lens was then injected into the capsular bag.  The remaining viscoelastic was aspirated.   Wounds were hydrated with balanced salt solution.  The anterior chamber was inflated to a physiologic pressure with balanced salt solution.    Intracameral vigamox 0.1 mL undiluted was injected into the eye and a drop placed onto the ocular surface.  No wound leaks were noted.  Maxitrol ointment, a patch and shield were placed.  The patient was taken to the recovery room in stable condition without complications of anesthesia or surgery.   Benay Pillow 06/18/2016, 9:16 AM

## 2016-06-18 NOTE — H&P (Signed)
The History and Physical notes are on paper, have been signed, and are to be scanned.   I have examined the patient and there are no changes to the H&P.   Willey BladeBradley King 06/18/2016 8:26 AM

## 2016-06-18 NOTE — Anesthesia Postprocedure Evaluation (Signed)
Anesthesia Post Note  Patient: Ann Ho  Procedure(s) Performed: Procedure(s) (LRB): CATARACT EXTRACTION PHACO AND INTRAOCULAR LENS PLACEMENT (IOC) INSERTION OF ISTENT (Left)  Patient location during evaluation: Short Stay Anesthesia Type: MAC Level of consciousness: awake, oriented and awake and alert Pain management: pain level controlled Vital Signs Assessment: post-procedure vital signs reviewed and stable Respiratory status: spontaneous breathing Cardiovascular status: stable Postop Assessment: no headache and adequate PO intake Anesthetic complications: no     Last Vitals:  Vitals:   06/18/16 0720 06/18/16 0914  BP: 126/73 120/62  Pulse: 60 60  Resp: 16 12  Temp: 36.5 C 36.6 C    Last Pain:  Vitals:   06/18/16 0914  TempSrc: Oral                 [name]

## 2016-06-18 NOTE — Discharge Instructions (Signed)
Eye Surgery Discharge Instructions  Expect mild scratchy sensation or mild soreness. DO NOT RUB YOUR EYE!  The day of surgery:  Minimal physical activity, but bed rest is not required  No reading, computer work, or close hand work  No bending, lifting, or straining.  May watch TV  For 24 hours:  No driving, legal decisions, or alcoholic beverages  Safety precautions  Eat anything you prefer: It is better to start with liquids, then soup then solid foods.  _____ Eye patch should be worn until postoperative exam tomorrow.  ____ Solar shield eyeglasses should be worn for comfort in the sunlight/patch while sleeping  Resume all regular medications including aspirin or Coumadin if these were discontinued prior to surgery. You may shower, bathe, shave, or wash your hair. Tylenol may be taken for mild discomfort.  Call your doctor if you experience significant pain, nausea, or vomiting, fever > 101 or other signs of infection. [phone] or [phone] Specific instructions:  Follow-up Information    [place] CraneKing, Bradley Mark, MD Follow up on 06/19/2016.   Specialty:  Ophthalmology Why:  9:10 Contact information: [address] [place] [address] [phone]

## 2017-03-25 ENCOUNTER — Other Ambulatory Visit: Payer: Self-pay | Admitting: Internal Medicine

## 2017-03-25 DIAGNOSIS — Z1231 Encounter for screening mammogram for malignant neoplasm of breast: Secondary | ICD-10-CM

## 2017-05-06 ENCOUNTER — Ambulatory Visit
Admission: RE | Admit: 2017-05-06 | Discharge: 2017-05-06 | Disposition: A | Discharge status: Home / Self Care | Payer: Medicare Other | Source: Ambulatory Visit | Attending: Internal Medicine | Admitting: Internal Medicine

## 2017-05-06 DIAGNOSIS — Z1231 Encounter for screening mammogram for malignant neoplasm of breast: Secondary | ICD-10-CM | POA: Insufficient documentation

## 2022-10-29 ENCOUNTER — Ambulatory Visit: Payer: Medicare Other

## 2022-10-29 DIAGNOSIS — Z23 Encounter for immunization: Secondary | ICD-10-CM | POA: Diagnosis not present

## 2022-10-29 DIAGNOSIS — Z719 Counseling, unspecified: Secondary | ICD-10-CM

## 2022-10-29 NOTE — Progress Notes (Signed)
In nurse clinic and requests covid vaccine.  See immunization flowsheet. VIS given and reviewed after vaccine care.  States she has already had her flu vaccine.  Comirnaty 12+ 2024-25 vaccine given and tolerated well.  [facility] updated and copy given.  Cherlynn Polo, RN
